# Patient Record
Sex: Female | Born: 1945 | Race: White | Hispanic: No | Marital: Married | Smoking: Former smoker
Health system: Southern US, Community
[De-identification: ages and names within clinical notes are randomized; demographics above are authoritative.]

## PROBLEM LIST (undated history)

## (undated) DIAGNOSIS — I10 Essential (primary) hypertension: Secondary | ICD-10-CM

## (undated) DIAGNOSIS — F419 Anxiety disorder, unspecified: Secondary | ICD-10-CM

## (undated) HISTORY — PX: CHOLECYSTECTOMY: SHX55

## (undated) HISTORY — PX: REVERSE TOTAL SHOULDER ARTHROPLASTY: SHX2344

---

## 2008-05-11 ENCOUNTER — Other Ambulatory Visit: Payer: Self-pay

## 2008-05-11 ENCOUNTER — Observation Stay: Payer: Self-pay | Admitting: General Surgery

## 2019-06-22 ENCOUNTER — Other Ambulatory Visit: Payer: Self-pay | Admitting: Family Medicine

## 2019-06-22 DIAGNOSIS — R935 Abnormal findings on diagnostic imaging of other abdominal regions, including retroperitoneum: Secondary | ICD-10-CM

## 2019-06-25 ENCOUNTER — Other Ambulatory Visit: Payer: Self-pay

## 2019-06-25 ENCOUNTER — Encounter (INDEPENDENT_AMBULATORY_CARE_PROVIDER_SITE_OTHER): Payer: Self-pay

## 2019-06-25 ENCOUNTER — Ambulatory Visit
Admission: RE | Admit: 2019-06-25 | Discharge: 2019-06-25 | Disposition: A | Discharge status: Home / Self Care | Payer: Medicare Other | Source: Ambulatory Visit | Attending: Family Medicine | Admitting: Family Medicine

## 2019-06-25 DIAGNOSIS — R935 Abnormal findings on diagnostic imaging of other abdominal regions, including retroperitoneum: Secondary | ICD-10-CM | POA: Diagnosis not present

## 2019-08-10 ENCOUNTER — Other Ambulatory Visit: Payer: Self-pay

## 2019-08-10 ENCOUNTER — Encounter
Admission: RE | Admit: 2019-08-10 | Discharge: 2019-08-10 | Disposition: A | Discharge status: Home / Self Care | Payer: Medicare Other | Source: Ambulatory Visit | Attending: Obstetrics & Gynecology | Admitting: Obstetrics & Gynecology

## 2019-08-10 HISTORY — DX: Anxiety disorder, unspecified: F41.9

## 2019-08-10 HISTORY — DX: Essential (primary) hypertension: I10

## 2019-08-10 NOTE — Patient Instructions (Addendum)
Your procedure is scheduled on: August 16, 2019 [place] Report to Day Surgery on the 2nd floor of the [facility]. To find out your arrival time, please call [phone] between 1PM - 3PM on: August 13, 2019 FRIDAY  REMEMBER: Instructions that are not followed completely may result in serious medical risk, up to and including death; or upon the discretion of your surgeon and anesthesiologist your surgery may need to be rescheduled.  Do not eat food after midnight the night before surgery.  No gum chewing, lozengers or hard candies.  You may however, drink CLEAR liquids up to 2 hours before you are scheduled to arrive for your surgery. Do not drink anything within 2 hours of the start of your surgery.  Clear liquids include: - water  - apple juice without pulp - CLEAR gatorade - black coffee or tea (Do NOT add milk or creamers to the coffee or tea) Do NOT drink anything that is not on this list.  Type 1 and Type 2 diabetics should only drink water.  No Alcohol for 24 hours before or after surgery.  No Smoking including e-cigarettes for 24 hours prior to surgery.  No chewable tobacco products for at least 6 hours prior to surgery.  No nicotine patches on the day of surgery.  On the morning of surgery brush your teeth with toothpaste and water, you may rinse your mouth with mouthwash if you wish. Do not swallow any toothpaste or mouthwash.  Notify your doctor if there is any change in your medical condition (cold, fever, infection).  Do not wear jewelry, make-up, hairpins, clips or nail polish.  Do not wear lotions, powders, or perfumes   Do not shave 48 hours prior to surgery.   Contacts and dentures may not be worn into surgery.  Do not bring valuables to the hospital, including drivers license, insurance or credit cards.  [facility] is not responsible for any belongings or valuables.   TAKE THESE MEDICATIONS THE MORNING OF SURGERY: METOPROLOL  TAKE A SHOWER  MORNING OF SURGERY  Follow recommendations from Cardiologist, Pulmonologist or PCP regarding stopping Aspirin, Coumadin, Plavix, Eliquis, Pradaxa, or Pletal. STOP ASPIRIN   Stop Anti-inflammatories (NSAIDS) such as Advil, Aleve, Ibuprofen, Motrin, Naproxen, Naprosyn and Aspirin based products such as Excedrin, Goodys Powder, BC Powder. (May take Tylenol or Acetaminophen if needed.)  Stop ANY OVER THE COUNTER supplements until after surgery. STOP OMEGA 3 AND BLACK COHOSH (May continue Vitamin D, Vitamin B, and multivitamin.)  Wear comfortable clothing (specific to your surgery type) to the hospital.  Plan for stool softeners for home use.  If you are being discharged the day of surgery, you will not be allowed to drive home. You will need a responsible adult to drive you home and stay with you that night.   If you are taking public transportation, you will need to have a responsible adult with you. Please confirm with your physician that it is acceptable to use public transportation.   Please call [phone] if you have any questions about these instructions.

## 2019-08-12 ENCOUNTER — Other Ambulatory Visit
Admission: RE | Admit: 2019-08-12 | Discharge: 2019-08-12 | Disposition: A | Discharge status: Home / Self Care | Payer: Medicare Other | Source: Ambulatory Visit | Attending: Obstetrics & Gynecology | Admitting: Obstetrics & Gynecology

## 2019-08-12 ENCOUNTER — Other Ambulatory Visit: Payer: Self-pay

## 2019-08-12 DIAGNOSIS — Z01812 Encounter for preprocedural laboratory examination: Secondary | ICD-10-CM | POA: Insufficient documentation

## 2019-08-12 DIAGNOSIS — Z20828 Contact with and (suspected) exposure to other viral communicable diseases: Secondary | ICD-10-CM | POA: Diagnosis not present

## 2019-08-12 LAB — SARS CORONAVIRUS 2 (TAT 6-24 HRS): SARS Coronavirus 2: NEGATIVE

## 2019-08-16 ENCOUNTER — Other Ambulatory Visit: Payer: Self-pay

## 2019-08-16 ENCOUNTER — Ambulatory Visit: Payer: Medicare Other | Admitting: Anesthesiology

## 2019-08-16 ENCOUNTER — Ambulatory Visit
Admission: RE | Admit: 2019-08-16 | Discharge: 2019-08-16 | Disposition: A | Discharge status: Home / Self Care | Payer: Medicare Other | Attending: Obstetrics & Gynecology | Admitting: Obstetrics & Gynecology

## 2019-08-16 ENCOUNTER — Encounter
Admission: RE | Disposition: A | Discharge status: Home / Self Care | Payer: Self-pay | Source: Home / Self Care | Attending: Obstetrics & Gynecology

## 2019-08-16 ENCOUNTER — Encounter: Payer: Self-pay | Admitting: Obstetrics & Gynecology

## 2019-08-16 DIAGNOSIS — Z87891 Personal history of nicotine dependence: Secondary | ICD-10-CM | POA: Diagnosis not present

## 2019-08-16 DIAGNOSIS — N950 Postmenopausal bleeding: Secondary | ICD-10-CM | POA: Insufficient documentation

## 2019-08-16 DIAGNOSIS — Z880 Allergy status to penicillin: Secondary | ICD-10-CM | POA: Insufficient documentation

## 2019-08-16 DIAGNOSIS — F419 Anxiety disorder, unspecified: Secondary | ICD-10-CM | POA: Insufficient documentation

## 2019-08-16 DIAGNOSIS — Z79899 Other long term (current) drug therapy: Secondary | ICD-10-CM | POA: Diagnosis not present

## 2019-08-16 DIAGNOSIS — Z7982 Long term (current) use of aspirin: Secondary | ICD-10-CM | POA: Insufficient documentation

## 2019-08-16 DIAGNOSIS — R9389 Abnormal findings on diagnostic imaging of other specified body structures: Secondary | ICD-10-CM | POA: Insufficient documentation

## 2019-08-16 DIAGNOSIS — I10 Essential (primary) hypertension: Secondary | ICD-10-CM | POA: Insufficient documentation

## 2019-08-16 DIAGNOSIS — N858 Other specified noninflammatory disorders of uterus: Secondary | ICD-10-CM | POA: Diagnosis present

## 2019-08-16 HISTORY — PX: HYSTEROSCOPY WITH D & C: SHX1775

## 2019-08-16 LAB — CBC
HCT: 45.3 % (ref 36.0–46.0)
Hemoglobin: 14.8 g/dL (ref 12.0–15.0)
MCH: 30.8 pg (ref 26.0–34.0)
MCHC: 32.7 g/dL (ref 30.0–36.0)
MCV: 94.2 fL (ref 80.0–100.0)
Platelets: 203 10*3/uL (ref 150–400)
RBC: 4.81 MIL/uL (ref 3.87–5.11)
RDW: 12 % (ref 11.5–15.5)
WBC: 7.4 10*3/uL (ref 4.0–10.5)
nRBC: 0 % (ref 0.0–0.2)

## 2019-08-16 LAB — BASIC METABOLIC PANEL
Anion gap: 10 (ref 5–15)
BUN: 22 mg/dL (ref 8–23)
CO2: 25 mmol/L (ref 22–32)
Calcium: 10 mg/dL (ref 8.9–10.3)
Chloride: 105 mmol/L (ref 98–111)
Creatinine, Ser: 0.84 mg/dL (ref 0.44–1.00)
GFR calc Af Amer: >60 mL/min (ref >60)
GFR calc non Af Amer: >60 mL/min (ref >60)
Glucose, Bld: 124 mg/dL — ABNORMAL HIGH (ref 70–99)
Potassium: 3.4 mmol/L — ABNORMAL LOW (ref 3.5–5.1)
Sodium: 140 mmol/L (ref 135–145)

## 2019-08-16 LAB — TYPE AND SCREEN
ABO/RH(D): O POS
Antibody Screen: NEGATIVE

## 2019-08-16 LAB — ABO/RH: ABO/RH(D): O POS

## 2019-08-16 SURGERY — DILATATION AND CURETTAGE /HYSTEROSCOPY
Anesthesia: General

## 2019-08-16 MED ORDER — LIDOCAINE HCL (CARDIAC) PF 100 MG/5ML IV SOSY
PREFILLED_SYRINGE | INTRAVENOUS | Status: DC | PRN
  Administered 2019-08-16: 60 mg via INTRAVENOUS

## 2019-08-16 MED ORDER — FENTANYL CITRATE (PF) 100 MCG/2ML IJ SOLN: 25.0000 ug | INTRAMUSCULAR | Status: DC | PRN

## 2019-08-16 MED ORDER — FENTANYL CITRATE (PF) 100 MCG/2ML IJ SOLN
INTRAMUSCULAR | Status: DC | PRN
  Administered 2019-08-16 (×4): 25 ug via INTRAVENOUS

## 2019-08-16 MED ORDER — FENTANYL CITRATE (PF) 100 MCG/2ML IJ SOLN
INTRAMUSCULAR | Status: AC
  Filled 2019-08-16: qty 2

## 2019-08-16 MED ORDER — DEXAMETHASONE SODIUM PHOSPHATE 10 MG/ML IJ SOLN
INTRAMUSCULAR | Status: DC | PRN
  Administered 2019-08-16: 5 mg via INTRAVENOUS

## 2019-08-16 MED ORDER — PROPOFOL 10 MG/ML IV BOLUS
INTRAVENOUS | Status: AC
  Filled 2019-08-16: qty 20

## 2019-08-16 MED ORDER — ONDANSETRON HCL 4 MG/2ML IJ SOLN
INTRAMUSCULAR | Status: DC | PRN
  Administered 2019-08-16: 4 mg via INTRAVENOUS

## 2019-08-16 MED ORDER — KETOROLAC TROMETHAMINE 30 MG/ML IJ SOLN
INTRAMUSCULAR | Status: DC | PRN
  Administered 2019-08-16: 30 mg via INTRAVENOUS

## 2019-08-16 MED ORDER — FAMOTIDINE 20 MG PO TABS
ORAL_TABLET | ORAL | Status: AC
  Administered 2019-08-16: 20 mg via ORAL
  Filled 2019-08-16: qty 1

## 2019-08-16 MED ORDER — KETOROLAC TROMETHAMINE 30 MG/ML IJ SOLN
INTRAMUSCULAR | Status: AC
  Filled 2019-08-16: qty 1

## 2019-08-16 MED ORDER — ONDANSETRON HCL 4 MG/2ML IJ SOLN: 4.0000 mg | Freq: Once | INTRAMUSCULAR | Status: DC | PRN

## 2019-08-16 MED ORDER — PROPOFOL 10 MG/ML IV BOLUS
INTRAVENOUS | Status: DC | PRN
  Administered 2019-08-16: 120 mg via INTRAVENOUS

## 2019-08-16 MED ORDER — LACTATED RINGERS IV SOLN: INTRAVENOUS | Status: DC

## 2019-08-16 MED ORDER — ONDANSETRON HCL 4 MG/2ML IJ SOLN
INTRAMUSCULAR | Status: AC
  Filled 2019-08-16: qty 2

## 2019-08-16 MED ORDER — EPHEDRINE SULFATE 50 MG/ML IJ SOLN
INTRAMUSCULAR | Status: DC | PRN
  Administered 2019-08-16: 5 mg via INTRAVENOUS
  Administered 2019-08-16: 10 mg via INTRAVENOUS

## 2019-08-16 MED ORDER — FAMOTIDINE 20 MG PO TABS: 20.0000 mg | ORAL_TABLET | Freq: Once | ORAL | Status: AC

## 2019-08-16 SURGICAL SUPPLY — 19 items
COVER WAND RF STERILE (DRAPES) ×3 IMPLANT
DEVICE MYOSURE LITE (MISCELLANEOUS) IMPLANT
DEVICE MYOSURE REACH (MISCELLANEOUS) IMPLANT
ELECT REM PT RETURN 9FT ADLT (ELECTROSURGICAL) ×3
ELECTRODE REM PT RTRN 9FT ADLT (ELECTROSURGICAL) ×1 IMPLANT
GLOVE PI ORTHOPRO 6.5 (GLOVE) ×2
GLOVE PI ORTHOPRO STRL 6.5 (GLOVE) ×1 IMPLANT
GLOVE SURG SYN 6.5 ES PF (GLOVE) ×6 IMPLANT
GOWN STRL REUS W/ TWL LRG LVL3 (GOWN DISPOSABLE) ×2 IMPLANT
GOWN STRL REUS W/TWL LRG LVL3 (GOWN DISPOSABLE) ×4
IV LACTATED RINGERS 1000ML (IV SOLUTION) ×3 IMPLANT
KIT PROCEDURE FLUENT (KITS) IMPLANT
KIT TURNOVER CYSTO (KITS) ×3 IMPLANT
PACK DNC HYST (MISCELLANEOUS) ×3 IMPLANT
PAD OB MATERNITY 4.3X12.25 (PERSONAL CARE ITEMS) ×3 IMPLANT
PAD PREP 24X41 OB/GYN DISP (PERSONAL CARE ITEMS) ×3 IMPLANT
SEAL ROD LENS SCOPE MYOSURE (ABLATOR) ×3 IMPLANT
TUBING CONNECTING 10 (TUBING) ×2 IMPLANT
TUBING CONNECTING 10' (TUBING) ×1

## 2019-08-16 NOTE — Anesthesia Postprocedure Evaluation (Signed)
Anesthesia Post Note  Patient: Margaret Holder  Procedure(s) Performed: DILATATION AND CURETTAGE /HYSTEROSCOPY (N/A )  Anesthesia Type: General     Last Vitals:  Vitals:   08/16/19 0828 08/16/19 1051  BP: (!) 170/76 (!) 143/64  Pulse: (!) 54 (!) 55  Resp: 18 14  Temp: 36.7 C 36.6 C  SpO2: 99% 98%    Last Pain:  Vitals:   08/16/19 1051  TempSrc:   PainSc: Asleep                 Dallie Dad

## 2019-08-16 NOTE — Anesthesia Post-op Follow-up Note (Signed)
Anesthesia QCDR form completed.        

## 2019-08-16 NOTE — Discharge Instructions (Signed)
AMBULATORY SURGERY  DISCHARGE INSTRUCTIONS   1) The drugs that you were given will stay in your system until tomorrow so for the next 24 hours you should not:  A) Drive an automobile B) Make any legal decisions C) Drink any alcoholic beverage   2) You may resume regular meals tomorrow.  Today it is better to start with liquids and gradually work up to solid foods.  You may eat anything you prefer, but it is better to start with liquids, then soup and crackers, and gradually work up to solid foods.   3) Please notify your doctor immediately if you have any unusual bleeding, trouble breathing, redness and pain at the surgery site, drainage, fever, or pain not relieved by medication. 4)   5) Your post-operative visit with Dr.                                     is: Date:                        Time:    Please call to schedule your post-operative visit.  6) Additional Instructions:      You should expect to have some cramping and vaginal bleeding for about a week. This should taper off and subside, much like a period. If heavy bleeding continues or gets worse, you should contact the office for an earlier appointment.   Please call the office or physician on call for fever >101, severe pain, and heavy bleeding.   [place]!!  Dr. Leonides Schanz will discuss pathology results with you at your postop visit.

## 2019-08-16 NOTE — H&P (Addendum)
Preoperative History and Physical  Margaret Holder is a 73 y.o. here for surgical management of incidental finding of uterine cyst on imaging.   No significant preoperative concerns.  Proposed surgery: Dilation curettage and hysteroscopy  Past Medical History:  Diagnosis Date  . Anxiety   . Hypertension    Past Surgical History:  Procedure Laterality Date  . CHOLECYSTECTOMY    . REVERSE TOTAL SHOULDER ARTHROPLASTY Right    OB History  No obstetric history on file.  Patient denies any other pertinent gynecologic issues.   No current facility-administered medications on file prior to encounter.   Current Outpatient Medications on File Prior to Encounter  Medication Sig Dispense Refill  . amLODipine (NORVASC) 5 MG tablet Take 5 mg by mouth daily.    Marland Kitchen aspirin EC 81 MG tablet Take 81 mg by mouth daily.    . Black Cohosh 20 MG TABS Take 40 mg by mouth daily.    . cholecalciferol (VITAMIN D3) 25 MCG (1000 UT) tablet Take 1,000 Units by mouth daily.    Marland Kitchen escitalopram (LEXAPRO) 5 MG tablet Take 5 mg by mouth daily.    Marland Kitchen lisinopril-hydrochlorothiazide (ZESTORETIC) 20-25 MG tablet Take 1 tablet by mouth daily.    Marland Kitchen lovastatin (MEVACOR) 10 MG tablet Take 10 mg by mouth at bedtime.    . Omega-3 1000 MG CAPS Take 2,000 capsules by mouth daily.     Allergies  Allergen Reactions  . Penicillins Other (See Comments) and Rash    Other Reaction: BAD RASH Other Reaction: BAD RASH     Social History:   reports that she has quit smoking. She has never used smokeless tobacco. She reports previous alcohol use. She reports that she does not use drugs.  History reviewed. No pertinent family history.  Review of Systems: Noncontributory  PHYSICAL EXAM: Blood pressure (!) 170/76, pulse (!) 54, temperature 98 F (36.7 C), temperature source Tympanic, resp. rate 18, SpO2 99 %. General appearance - alert, well appearing, and in no distress Chest - clear to auscultation, no wheezes, rales or  rhonchi, symmetric air entry Heart - normal rate and regular rhythm Abdomen - soft, nontender, nondistended, no masses or organomegaly Pelvic - examination not indicated Extremities - peripheral pulses normal, no pedal edema, no clubbing or cyanosis  Labs: Results for orders placed or performed during the hospital encounter of 08/16/19 (from the past 336 hour(s))  CBC   Collection Time: 08/16/19  8:35 AM  Result Value Ref Range   WBC 7.4 4.0 - 10.5 K/uL   RBC 4.81 3.87 - 5.11 MIL/uL   Hemoglobin 14.8 12.0 - 15.0 g/dL   HCT 17.6 16.0 - 73.7 %   MCV 94.2 80.0 - 100.0 fL   MCH 30.8 26.0 - 34.0 pg   MCHC 32.7 30.0 - 36.0 g/dL   RDW 10.6 26.9 - 48.5 %   Platelets 203 150 - 400 K/uL   nRBC 0.0 0.0 - 0.2 %  Basic metabolic panel   Collection Time: 08/16/19  8:35 AM  Result Value Ref Range   Sodium 140 135 - 145 mmol/L   Potassium 3.4 (L) 3.5 - 5.1 mmol/L   Chloride 105 98 - 111 mmol/L   CO2 25 22 - 32 mmol/L   Glucose, Bld 124 (H) 70 - 99 mg/dL   BUN 22 8 - 23 mg/dL   Creatinine, Ser 4.62 0.44 - 1.00 mg/dL   Calcium 70.3 8.9 - 50.0 mg/dL   GFR calc non Af Amer >60 >60  mL/min   GFR calc Af Amer >60 >60 mL/min   Anion gap 10 5 - 15  Type and screen [place]   Collection Time: 08/16/19  8:35 AM  Result Value Ref Range   ABO/RH(D) PENDING    Antibody Screen PENDING    Sample Expiration      08/19/2019,2359 Performed at [facility], [address] [place], [place] [address]   ABO/Rh   Collection Time: 08/16/19  8:39 AM  Result Value Ref Range   ABO/RH(D) PENDING   Results for orders placed or performed during the hospital encounter of 08/12/19 (from the past 336 hour(s))  SARS CORONAVIRUS 2 (TAT 6-24 HRS) Nasopharyngeal Nasopharyngeal Swab   Collection Time: 08/12/19 10:47 AM   Specimen: Nasopharyngeal Swab  Result Value Ref Range   SARS Coronavirus 2 NEGATIVE NEGATIVE    Imaging Studies: No results found.  Assessment: Patient Active  Problem List   Diagnosis Date Noted  . Uterine cyst 08/16/2019    Plan: Patient will undergo surgical management with Dilation curettage and hysteroscopy.   The risks of surgery were discussed in detail with the patient including but not limited to: bleeding which may require transfusion or reoperation; infection which may require antibiotics; injury to surrounding organs which may involve bowel, bladder, ureters ; need for additional procedures including laparoscopy or laparotomy; thromboembolic phenomenon, surgical site problems and other postoperative/anesthesia complications. Likelihood of success in alleviating the patient's condition was discussed. Routine postoperative instructions will be reviewed with the patient and her family in detail after surgery.  The patient concurred with the proposed plan, giving informed written consent for the surgery.  Patient has been NPO since last night she will remain NPO for procedure.  Anesthesia and OR aware. SCDs ordered on call to the OR.  To OR when ready.  ----- Larey Days, MD, [place] Attending Obstetrician and Gynecologist [facility], Department of [facility]  08/16/2019 9:36 AM

## 2019-08-16 NOTE — Op Note (Signed)
Operative Report Hysteroscopy, Dilation and Curettage 08/16/2019  Patient:  Margaret Holder  73 y.o. female Preoperative diagnosis:  Thickened Endometrium Postoperative diagnosis:  Thickened Endometrium  PROCEDURE:  Procedure(s): DILATATION AND CURETTAGE /HYSTEROSCOPY (N/A) Surgeon:  Surgeon(s) and Role:    * Nadeem Romanoski, Honor Loh, MD - Primary Anesthesia:  LMA I/O: Total I/O In: 400 [I.V.:400] Out: 5 [Blood:5] Specimens:  Endometrial curettings Complications: None Apparent Disposition:  VS stable to PACU  Findings: Uterus, mobile, top-normal size, sounding to11 cm; normal cervix, vagina, perineum.  Hysteroscopic findings:  Mostly atrophic lining, two polyps, one anterior and one cervical, posteriorly just prior to internal os.  Indication for procedure/Consents: 73 y.o.  here for scheduled surgery for the aforementioned diagnoses Risks of surgery were discussed with the patient including but not limited to: bleeding which may require transfusion; infection which may require antibiotics; injury to uterus or surrounding organs; intrauterine scarring which may impair future fertility; need for additional procedures including laparotomy or laparoscopy; and other postoperative/anesthesia complications. Written informed consent was obtained.    Procedure Details:   The patient was then taken to the operating room where anesthesia was administered and was found to be adequate.  After a formal timeout was performed, she was placed in the dorsal lithotomy position and examined with the above findings. She was then prepped and draped in the sterile manner.  A speculum was then placed in the patient's vagina and a single tooth tenaculum was applied to the anterior lip of the cervix.     The uterus was sounded to 11cm. Her cervix was serially dilated to accommodate the myoscope, with findings as above. Uterine forceps were placed and grasped each polyp and extracted them.  The hysteroscope was reinserted  and a portion of the anterior polyp was still remaining.  The forceps were reinserted and retrieval was successful.   A sharp curettage was then performed until there was a gritty texture in all four quadrants. The specimens were handed off to nursing.  The camera was reinserted and confirmed the uterus had been evacuated. The tenaculum was removed from the anterior lip of the cervix and the vaginal speculum was removed after noting good hemostasis. The patient tolerated the procedure well and was taken to the recovery area awake, extubated and in stable condition.  The patient will be discharged to home as per PACU criteria.  Routine postoperative instructions given. She will follow up in the clinic in two to four weeks for postoperative evaluation.  Larey Days, MD [facility] Attending Gynecologist

## 2019-08-16 NOTE — Anesthesia Preprocedure Evaluation (Signed)
Anesthesia Evaluation  Patient identified by MRN, date of birth, ID band Patient awake    Reviewed: Allergy & Precautions, H&P , NPO status , Patient's Chart, lab work & pertinent test results, reviewed documented beta blocker date and time   History of Anesthesia Complications Negative for: history of anesthetic complications  Airway Mallampati: III  TM Distance: >3 FB Neck ROM: full    Dental  (+) Dental Advidsory Given, Caps, Teeth Intact Permanent bridge on bottom left:   Pulmonary neg pulmonary ROS, former smoker,    Pulmonary exam normal        Cardiovascular Exercise Tolerance: Good hypertension, (-) angina(-) Past MI and (-) Cardiac Stents Normal cardiovascular exam(-) dysrhythmias (-) Valvular Problems/Murmurs     Neuro/Psych negative neurological ROS  negative psych ROS   GI/Hepatic negative GI ROS, Neg liver ROS,   Endo/Other  negative endocrine ROS  Renal/GU negative Renal ROS  negative genitourinary   Musculoskeletal   Abdominal   Peds  Hematology negative hematology ROS (+)   Anesthesia Other Findings Past Medical History: No date: Anxiety No date: Hypertension   Reproductive/Obstetrics negative OB ROS                             Anesthesia Physical Anesthesia Plan  ASA: II  Anesthesia Plan: General   Post-op Pain Management:    Induction: Intravenous  PONV Risk Score and Plan: 3 and Ondansetron, Dexamethasone and Treatment may vary due to age or medical condition  Airway Management Planned: LMA  Additional Equipment:   Intra-op Plan:   Post-operative Plan: Extubation in OR  Informed Consent: I have reviewed the patients History and Physical, chart, labs and discussed the procedure including the risks, benefits and alternatives for the proposed anesthesia with the patient or authorized representative who has indicated his/her understanding and acceptance.      Dental Advisory Given  Plan Discussed with: Anesthesiologist, CRNA and Surgeon  Anesthesia Plan Comments:         Anesthesia Quick Evaluation

## 2019-08-16 NOTE — Anesthesia Procedure Notes (Signed)
Procedure Name: LMA Insertion Performed by: Letitia Neri, CRNA Pre-anesthesia Checklist: Patient identified, Patient being monitored, Timeout performed, Emergency Drugs available and Suction available Patient Re-evaluated:Patient Re-evaluated prior to induction Oxygen Delivery Method: Circle system utilized Preoxygenation: Pre-oxygenation with 100% oxygen Induction Type: IV induction Ventilation: Mask ventilation without difficulty LMA: LMA inserted LMA Size: 4.0 Tube type: Oral Number of attempts: 1 Placement Confirmation: positive ETCO2 and breath sounds checked- equal and bilateral Tube secured with: Tape Dental Injury: Teeth and Oropharynx as per pre-operative assessment

## 2019-08-16 NOTE — OR Nursing (Signed)
Dr. Rosey Bath made aware and reviewed pt labs and EKG done in pre op at this time.

## 2019-08-16 NOTE — Transfer of Care (Signed)
Immediate Anesthesia Transfer of Care Note  Patient: KARRINGTON MCCRAVY  Procedure(s) Performed: DILATATION AND CURETTAGE /HYSTEROSCOPY (N/A )  Patient Location: PACU  Anesthesia Type:General  Level of Consciousness: awake and alert   Airway & Oxygen Therapy: Patient Spontanous Breathing  Post-op Assessment: Report given to RN and Post -op Vital signs reviewed and stable  Post vital signs: Reviewed and stable  Last Vitals:  Vitals Value Taken Time  BP 143/64 08/16/19 1051  Temp    Pulse 56 08/16/19 1053  Resp 23 08/16/19 1053  SpO2 96 % 08/16/19 1053  Vitals shown include unvalidated device data.  Last Pain:  Vitals:   08/16/19 0828  TempSrc: Tympanic  PainSc: 0-No pain         Complications: No apparent anesthesia complications

## 2019-08-17 ENCOUNTER — Encounter: Payer: Self-pay | Admitting: *Deleted

## 2019-08-17 LAB — SURGICAL PATHOLOGY

## 2019-11-09 ENCOUNTER — Other Ambulatory Visit: Payer: Self-pay

## 2019-11-09 ENCOUNTER — Ambulatory Visit
Admission: RE | Admit: 2019-11-09 | Discharge: 2019-11-09 | Disposition: A | Discharge status: Home / Self Care | Payer: Medicare Other | Attending: Family Medicine | Admitting: Family Medicine

## 2019-11-09 ENCOUNTER — Other Ambulatory Visit: Payer: Self-pay | Admitting: Family Medicine

## 2019-11-09 ENCOUNTER — Ambulatory Visit
Admission: RE | Admit: 2019-11-09 | Discharge: 2019-11-09 | Disposition: A | Discharge status: Home / Self Care | Payer: Medicare Other | Source: Ambulatory Visit | Attending: Family Medicine | Admitting: Family Medicine

## 2019-11-09 DIAGNOSIS — W19XXXA Unspecified fall, initial encounter: Secondary | ICD-10-CM

## 2019-11-09 DIAGNOSIS — S5012XA Contusion of left forearm, initial encounter: Secondary | ICD-10-CM | POA: Insufficient documentation

## 2019-11-09 DIAGNOSIS — M25429 Effusion, unspecified elbow: Secondary | ICD-10-CM | POA: Diagnosis not present

## 2019-11-19 ENCOUNTER — Ambulatory Visit
Admission: RE | Admit: 2019-11-19 | Discharge: 2019-11-19 | Disposition: A | Discharge status: Home / Self Care | Payer: Medicare Other | Source: Ambulatory Visit | Attending: Family Medicine | Admitting: Family Medicine

## 2019-11-19 ENCOUNTER — Other Ambulatory Visit: Payer: Self-pay | Admitting: Family Medicine

## 2019-11-19 ENCOUNTER — Other Ambulatory Visit: Payer: Self-pay

## 2019-11-19 ENCOUNTER — Ambulatory Visit
Admission: RE | Admit: 2019-11-19 | Discharge: 2019-11-19 | Disposition: A | Discharge status: Home / Self Care | Payer: Medicare Other | Attending: Family Medicine | Admitting: Family Medicine

## 2019-11-19 DIAGNOSIS — R936 Abnormal findings on diagnostic imaging of limbs: Secondary | ICD-10-CM | POA: Insufficient documentation

## 2019-11-24 ENCOUNTER — Ambulatory Visit
Admission: RE | Admit: 2019-11-24 | Discharge: 2019-11-24 | Disposition: A | Discharge status: Home / Self Care | Payer: Medicare Other | Source: Ambulatory Visit | Attending: Family Medicine | Admitting: Family Medicine

## 2019-11-24 ENCOUNTER — Ambulatory Visit
Admission: RE | Admit: 2019-11-24 | Discharge: 2019-11-24 | Disposition: A | Discharge status: Home / Self Care | Payer: Medicare Other | Attending: Family Medicine | Admitting: Family Medicine

## 2019-11-24 ENCOUNTER — Other Ambulatory Visit: Payer: Self-pay | Admitting: Family Medicine

## 2019-11-24 ENCOUNTER — Other Ambulatory Visit: Payer: Self-pay

## 2019-11-24 DIAGNOSIS — S300XXA Contusion of lower back and pelvis, initial encounter: Secondary | ICD-10-CM

## 2020-02-14 ENCOUNTER — Other Ambulatory Visit: Payer: Self-pay | Admitting: Family Medicine

## 2020-02-14 ENCOUNTER — Ambulatory Visit
Admission: RE | Admit: 2020-02-14 | Discharge: 2020-02-14 | Disposition: A | Discharge status: Home / Self Care | Payer: Medicare Other | Source: Ambulatory Visit | Attending: Family Medicine | Admitting: Family Medicine

## 2020-02-14 ENCOUNTER — Ambulatory Visit
Admission: RE | Admit: 2020-02-14 | Discharge: 2020-02-14 | Disposition: A | Discharge status: Home / Self Care | Payer: Medicare Other | Attending: Family Medicine | Admitting: Family Medicine

## 2020-02-14 ENCOUNTER — Other Ambulatory Visit: Payer: Self-pay

## 2020-02-14 DIAGNOSIS — S9031XA Contusion of right foot, initial encounter: Secondary | ICD-10-CM | POA: Diagnosis present

## 2021-04-18 ENCOUNTER — Ambulatory Visit (INDEPENDENT_AMBULATORY_CARE_PROVIDER_SITE_OTHER): Payer: Medicare Other | Admitting: Emergency Medicine

## 2021-04-18 ENCOUNTER — Encounter: Payer: Self-pay | Admitting: Emergency Medicine

## 2021-04-18 ENCOUNTER — Other Ambulatory Visit: Payer: Self-pay

## 2021-04-18 DIAGNOSIS — R918 Other nonspecific abnormal finding of lung field: Secondary | ICD-10-CM

## 2021-04-18 NOTE — Progress Notes (Signed)
Subjective:    Patient ID: Margaret Holder, female    DOB: 14-Apr-1946, 75 y.o.   MRN: [id number]  HPI  75 year old former smoker (5-10 pack years) with a history of hypertension, anxiety.  She was found to have pulmonary nodular disease on CT scan of the chest performed 03/23/19.  Subsequent PET scan 03/31/2019 confirmed a 1.2 cm medial right middle lobe nodule with minimal uptake (SUV max 1.8) with some additional smaller pulmonary nodules all unchanged.  Repeat CT [facility]/2019 showed that none of the nodules had changed, including the 1.2 cm central right middle lobe nodule, 6 mm medial right middle lobe nodule.  There was some improvement in mild groundglass noted at the left base and lingula.   No hx auto-immune dz   Review of Systems As per HPI  Past Medical History:  Diagnosis Date   Anxiety    Hypertension      No family history on file.  No family Hx lung CA   Social History   Socioeconomic History   Marital status: Married    Spouse name: Not on file   Number of children: Not on file   Years of education: Not on file   Highest education level: Not on file  Occupational History   Not on file  Tobacco Use   Smoking status: Former    Packs/day: 0.25    Years: 20.00    Pack years: 5.00    Types: Cigarettes   Smokeless tobacco: Never  Vaping Use   Vaping Use: Not on file  Substance and Sexual Activity   Alcohol use: Not Currently   Drug use: Never   Sexual activity: Not on file  Other Topics Concern   Not on file  Social History Narrative   Not on file   Social Determinants of Health   Financial Resource Strain: Not on file  Food Insecurity: Not on file  Transportation Needs: Not on file  Physical Activity: Not on file  Stress: Not on file  Social Connections: Not on file  Intimate Partner Violence: Not on file    Worked in medical records.  Substitute teaching No exposures Never TB exposure.  [place] native No mold exposure  Allergies   Allergen Reactions   Penicillins Other (See Comments) and Rash    Other Reaction: BAD RASH Other Reaction: BAD RASH      Outpatient Medications Prior to Visit  Medication Sig Dispense Refill   aspirin EC 81 MG tablet Take 81 mg by mouth daily.     cholecalciferol (VITAMIN D3) 25 MCG (1000 UT) tablet Take 1,000 Units by mouth daily.     lisinopril-hydrochlorothiazide (ZESTORETIC) 20-25 MG tablet Take 1 tablet by mouth daily.     lovastatin (MEVACOR) 10 MG tablet Take 10 mg by mouth at bedtime.     metoprolol tartrate (LOPRESSOR) 100 MG tablet Take 100 mg by mouth daily.     amLODipine (NORVASC) 5 MG tablet Take 5 mg by mouth daily.     Black Cohosh 20 MG TABS Take 40 mg by mouth daily.     chlorthalidone (HYGROTON) 25 MG tablet Take 25 mg by mouth daily.     escitalopram (LEXAPRO) 5 MG tablet Take 5 mg by mouth daily.     Omega-3 1000 MG CAPS Take 2,000 capsules by mouth daily.     No facility-administered medications prior to visit.         Objective:   Physical Exam Vitals:  04/18/21 1516  BP: 136/74  Pulse: 65  Temp: 98.7 F (37.1 C)  TempSrc: Oral  SpO2: 98%  Weight: 197 lb 12.8 oz (89.7 kg)  Height: 5\' 3"  (1.6 m)   Gen: Pleasant, well-nourished, in no distress,  normal affect  ENT: No lesions,  mouth clear,  oropharynx clear, no postnasal drip  Neck: No JVD, no stridor  Lungs: No use of accessory muscles, no crackles or wheezing on normal respiration, no wheeze on forced expiration  Cardiovascular: RRR, heart sounds normal, no murmur or gallops, no peripheral edema  Musculoskeletal: No deformities, no cyanosis or clubbing  Neuro: alert, awake, non focal  Skin: Warm, no lesions or rash      Assessment & Plan:   Pulmonary nodules Pulmonary nodules that have been stable to date on serial imaging. Low risk patient, but based on size of the nodules at least 2 years serial follow up indicated, initial scan 03/2019.    We will repeat your CT scan of the  chest at [facility] to follow your pulmonary nodules for stability. Follow Dr. 04/2019 next available after your CT so that we can review the results together.   Delton Coombes, MD, PhD 04/27/2021, 1:28 PM [facility] [phone] or if no answer before 7:00PM call [phone] For any issues after 7:00PM please call eLink [phone]

## 2021-04-18 NOTE — Patient Instructions (Signed)
We will repeat your CT scan of the chest at [place] to follow your pulmonary nodules for stability. Follow Dr. Delton Coombes next available after your CT so that we can review the results together.

## 2021-04-18 NOTE — Assessment & Plan Note (Addendum)
Pulmonary nodules that have been stable to date on serial imaging. Low risk patient, but based on size of the nodules at least 2 years serial follow up indicated, initial scan 03/2019.    We will repeat your CT scan of the chest at  to follow your pulmonary nodules for stability. Follow Dr. Delton Coombes next available after your CT so that we can review the results together.

## 2021-04-27 ENCOUNTER — Encounter: Payer: Self-pay | Admitting: Emergency Medicine

## 2021-05-02 ENCOUNTER — Ambulatory Visit
Admission: RE | Admit: 2021-05-02 | Discharge: 2021-05-02 | Disposition: A | Discharge status: Home / Self Care | Payer: Medicare Other | Source: Ambulatory Visit | Attending: Emergency Medicine | Admitting: Emergency Medicine

## 2021-05-02 ENCOUNTER — Other Ambulatory Visit: Payer: Self-pay

## 2021-05-02 DIAGNOSIS — R918 Other nonspecific abnormal finding of lung field: Secondary | ICD-10-CM | POA: Insufficient documentation

## 2021-05-11 ENCOUNTER — Telehealth: Payer: Self-pay | Admitting: Emergency Medicine

## 2021-05-11 NOTE — Telephone Encounter (Signed)
Pt called and is requesting the results of the CT scan. RB please advise. Thanks

## 2021-05-14 NOTE — Telephone Encounter (Signed)
Called and spoke with pt letting her know the results of recent CT and she verbalized understanding. Nothing further needed.

## 2021-05-14 NOTE — Telephone Encounter (Signed)
Please let the patient know that I have reviewed her CT. Her pulmonary nodules are stable in size compared with her priors. Good news.

## 2022-04-01 ENCOUNTER — Encounter: Payer: Self-pay | Admitting: Family Medicine

## 2022-04-01 ENCOUNTER — Encounter: Payer: Self-pay | Admitting: Internal Medicine

## 2023-01-08 IMAGING — CT CT CHEST SUPER D W/O CM
2 of 5 series · 15 of 36 positions shown, 18 images · non-contrast
Comparison: None.

CLINICAL DATA: Pulmonary nodules.

EXAM:
CT CHEST WITHOUT CONTRAST
TECHNIQUE: Multidetector CT imaging of the chest was performed using thin slice
collimation for electromagnetic bronchoscopy planning purposes,
without intravenous contrast.

[Series 4: thins chest 0.60 · axial · 0.65mm/px · z∈[-1288,-1055]mm · 12 of 440 slices shown, 15 images]
[im 26/440  mediastinal]
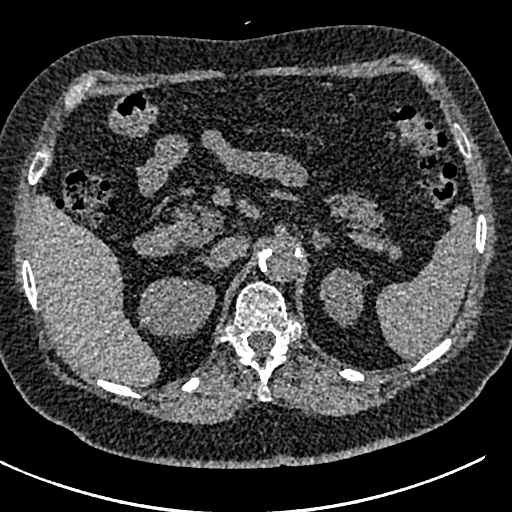
[im 26/440  lung]
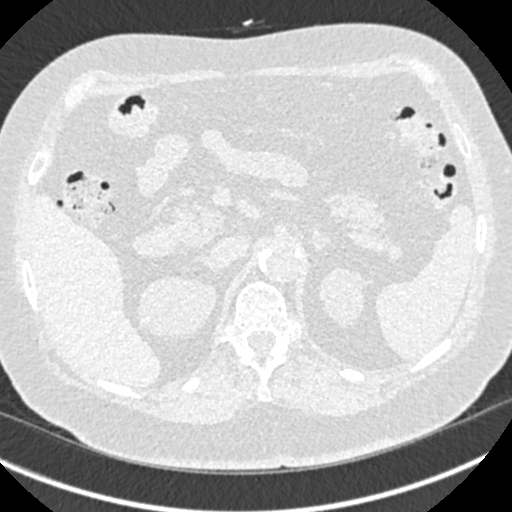
[im 78/440  lung]
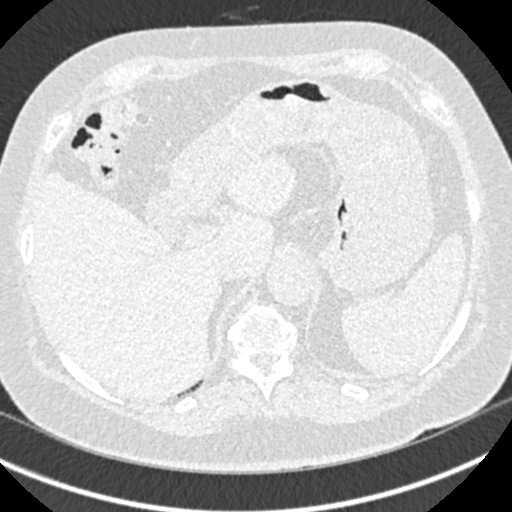
[im 104/440  lung]
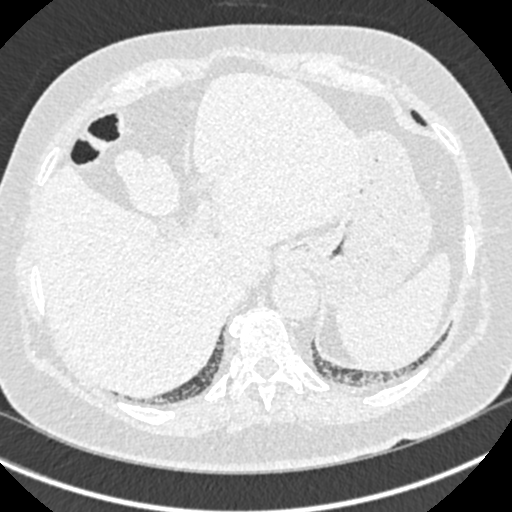
[im 130/440  lung]
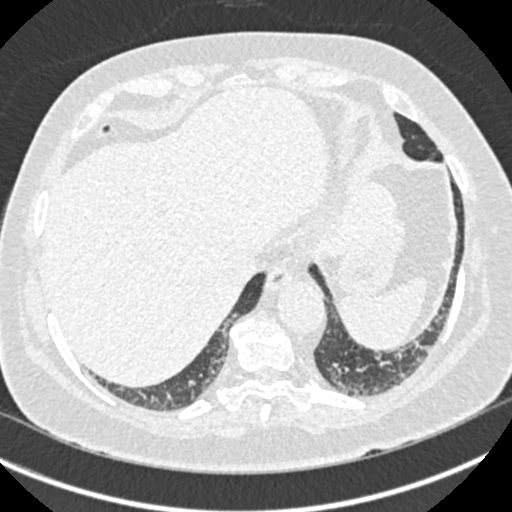
[im 181/440  mediastinal]
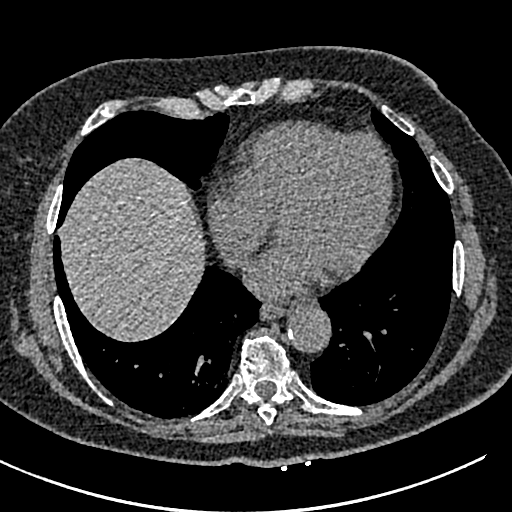
[im 181/440  lung]
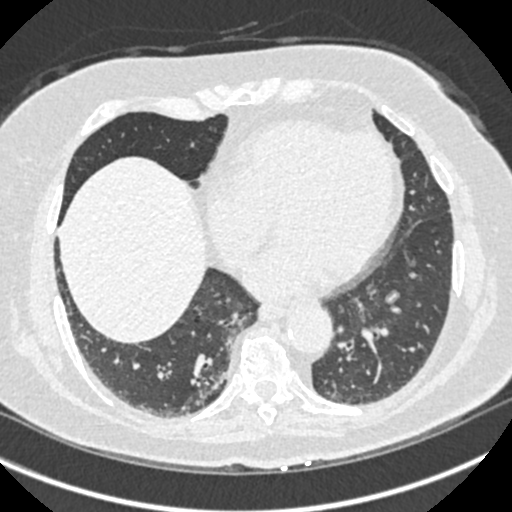
[im 207/440  lung]
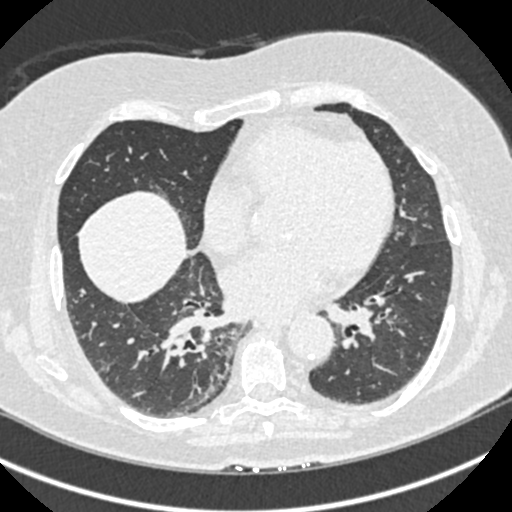
[im 233/440  lung]
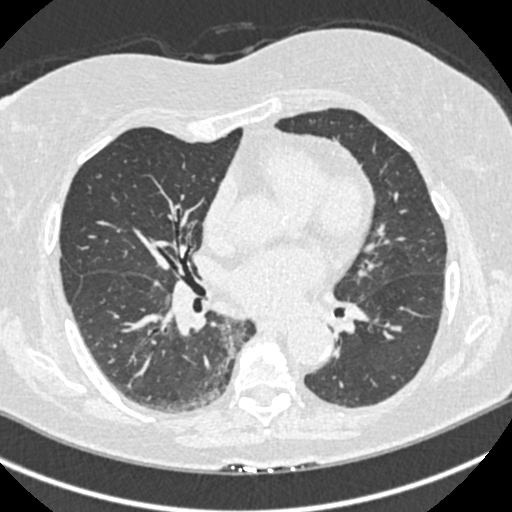
[im 285/440  lung]
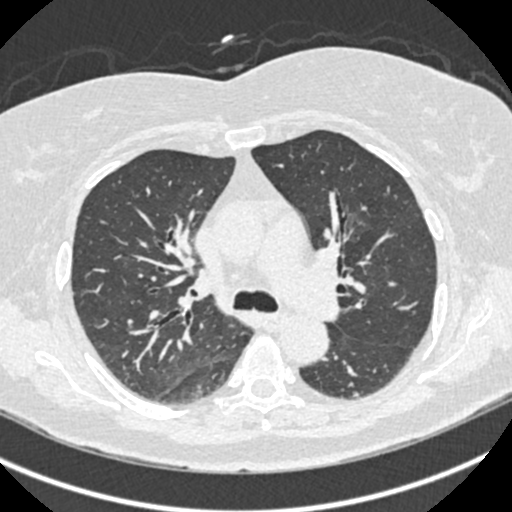
[im 310/440  mediastinal]
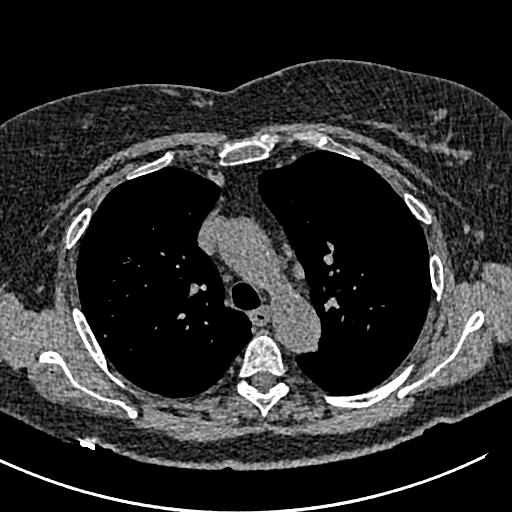
[im 310/440  lung]
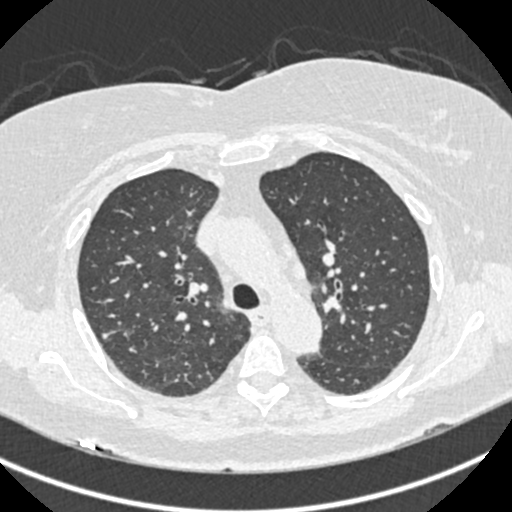
[im 336/440  lung]
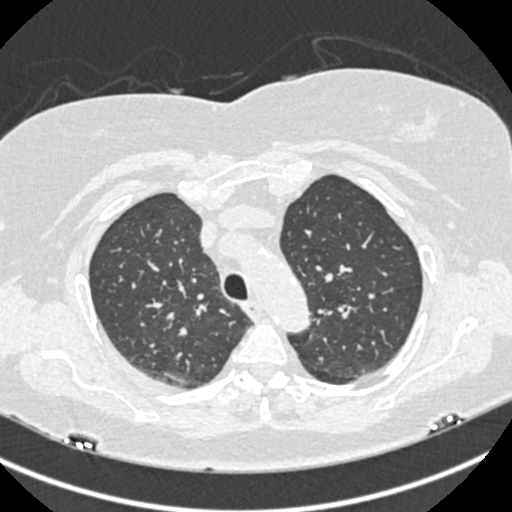
[im 388/440  lung]
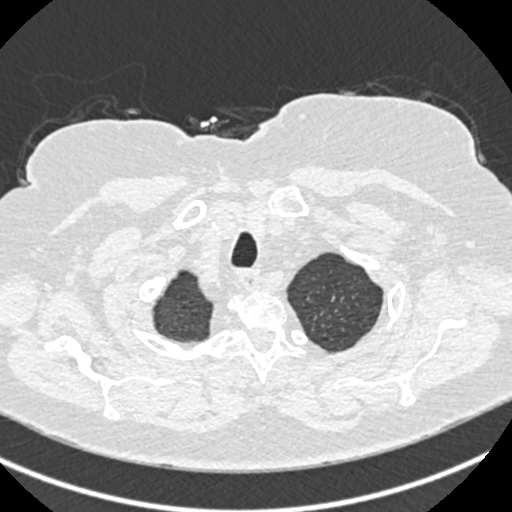
[im 414/440  lung]
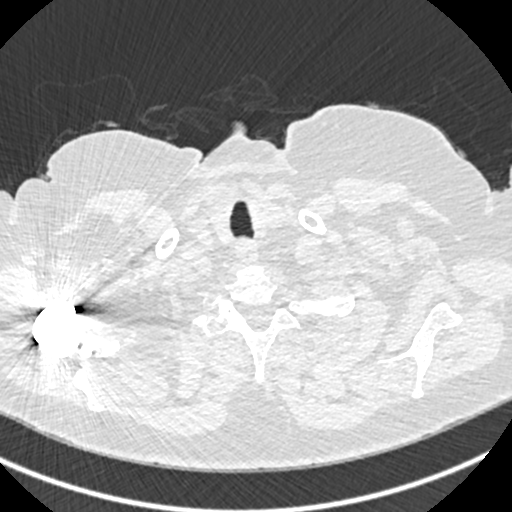

[Series 5: coronals chest 2.00 cor · coronal · 0.52mm/px · 3 of 146 slices shown]
[im 30/146  lung]
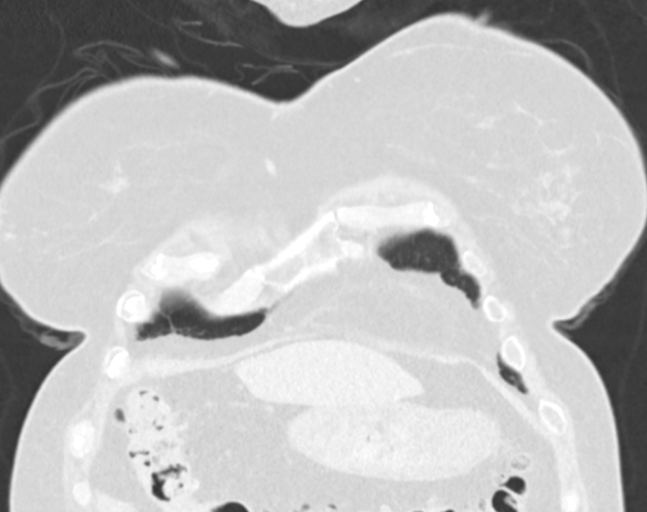
[im 59/146  lung]
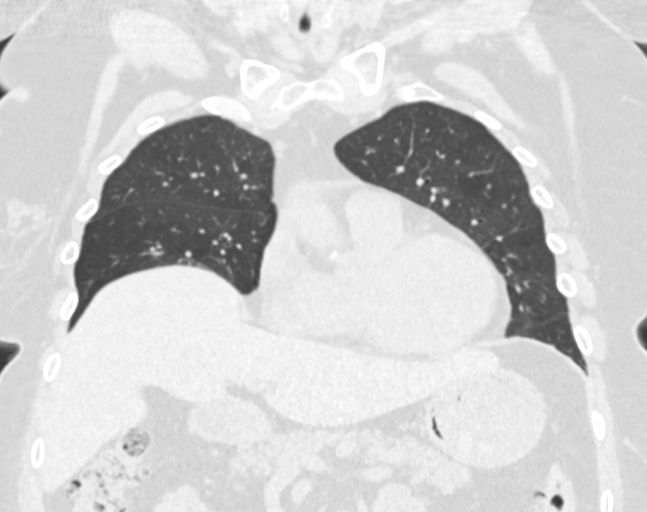
[im 88/146  lung]
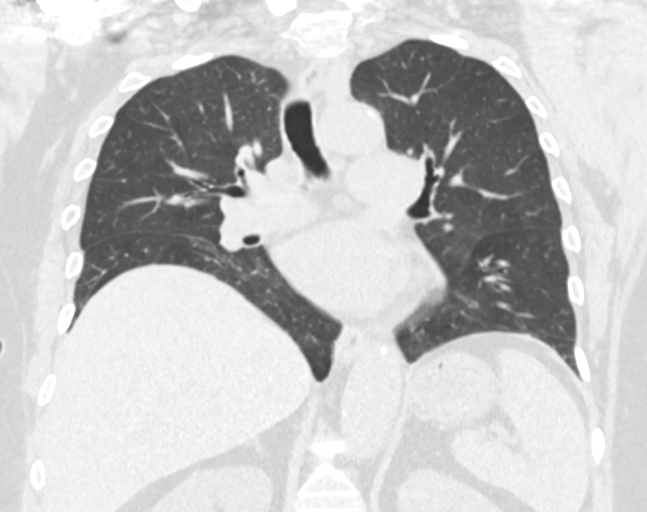

[15 of 36 positions shown; findings below may reference images not displayed]

FINDINGS: Cardiovascular: Heart size upper normal. Coronary artery
calcification is evident. Mild atherosclerotic calcification is
noted in the wall of the thoracic aorta.

Mediastinum/Nodes: No mediastinal lymphadenopathy. No evidence for
gross hilar lymphadenopathy although assessment is limited by the
lack of intravenous contrast on the current study. The esophagus has
normal imaging features. There is no axillary lymphadenopathy.

Lungs/Pleura: 3 mm right upper lobe nodule identified on 40/3. 6 mm
right middle lobe nodule identified on 62/3. 10 mm infrahilar right
middle lobe nodule identified on 66/3. 4 mm left lower lobe nodule
identified on 46/3. Several additional tiny 2-3 mm nodules are seen
bilaterally. No focal airspace consolidation. There is no evidence
of pleural effusion.

Upper Abdomen: Unremarkable.

Musculoskeletal: No worrisome lytic or sclerotic osseous
abnormality.
IMPRESSION: Multiple bilateral pulmonary nodules measuring up to 10 mm. Imaging
features are nonspecific and may be related to
infectious/inflammatory etiology. Metastatic disease not excluded.
Non-contrast chest CT at 3-6 months is recommended. If the nodules
are stable at time of repeat CT, then future CT at 18-24 months
(from today's scan) is considered optional for low-risk patients,
but is recommended for high-risk patients. This recommendation
follows the consensus statement: Guidelines for Management of
Incidental Pulmonary Nodules Detected on CT Images: From the

Aortic Atherosclerosis ([id number]-YTF.F).
